# Patient Record
Sex: Male | Born: 1950 | Race: White | Hispanic: No | Marital: Married | Smoking: Never smoker
Health system: Southern US, Community
[De-identification: ages and names within clinical notes are randomized; demographics above are authoritative.]

## PROBLEM LIST (undated history)

## (undated) DIAGNOSIS — I499 Cardiac arrhythmia, unspecified: Secondary | ICD-10-CM

## (undated) DIAGNOSIS — M1990 Unspecified osteoarthritis, unspecified site: Secondary | ICD-10-CM

## (undated) DIAGNOSIS — R7303 Prediabetes: Secondary | ICD-10-CM

## (undated) DIAGNOSIS — I10 Essential (primary) hypertension: Secondary | ICD-10-CM

## (undated) HISTORY — PX: KNEE ARTHROSCOPY: SUR90

## (undated) HISTORY — PX: SHOULDER ARTHROSCOPY: SHX128

## (undated) HISTORY — PX: MULTIPLE TOOTH EXTRACTIONS: SHX2053

## (undated) HISTORY — PX: HERNIA REPAIR: SHX51

## (undated) HISTORY — PX: CARDIAC CATHETERIZATION: SHX172

---

## 2018-06-19 ENCOUNTER — Other Ambulatory Visit: Payer: Self-pay | Admitting: Orthopedic Surgery

## 2018-06-26 NOTE — Pre-Procedure Instructions (Signed)
Gordon CambridgeRandy Lee Little  06/26/2018      [id number] - [place], [place] - [id number] [place] [id number] Phone: [phone] Fax: [phone]    Your procedure is scheduled on July 09, 2018.  [facility] to [facility] Admitting at [phone] AM.  Call this number if you have problems the morning of surgery:  [phone]   Remember:  Do not eat or drink after midnight.    Take these medicines the morning of surgery with A SIP OF WATER  Amlodipine (norvasc) Omeprazole (prilosec) Afrin Nasal spray   Follow your surgeon's instructions on when to hold/resume aspirin.  If no instructions were given call the office to determine how they would like to you take aspirin  7 days prior to surgery STOP taking any diclofenac (cataflam), Aleve, Naproxen, Ibuprofen, Motrin, Advil, Goody's, BC's, all herbal medications, fish oil, and all vitamins   WHAT DO I DO ABOUT MY DIABETES MEDICATION?  Marland Kitchen. Do not take oral diabetes medicines (pills) the morning of surgery-metformin (glucophage).  Reviewed and Endorsed by [facility] Patient Education Committee, August 2015  How to Manage Your Diabetes Before and After Surgery  Why is it important to control my blood sugar before and after surgery? . Improving blood sugar levels before and after surgery helps healing and can limit problems. . A way of improving blood sugar control is eating a healthy diet by: o  Eating less sugar and carbohydrates o  Increasing activity/exercise o  Talking with your doctor about reaching your blood sugar goals . High blood sugars (greater than 180 mg/dL) can raise your risk of infections and slow your recovery, so you will need to focus on controlling your diabetes during the weeks before surgery. . Make sure that the doctor who takes care of your diabetes knows about your planned surgery including the date and location.  How do I manage my blood sugar before surgery? . Check your  blood sugar at least 4 times a day, starting 2 days before surgery, to make sure that the level is not too high or low. o Check your blood sugar the morning of your surgery when you wake up and every 2 hours until you get to the Short Stay unit. . If your blood sugar is less than 70 mg/dL, you will need to treat for low blood sugar: o Do not take insulin. o Treat a low blood sugar (less than 70 mg/dL) with  cup of clear juice (cranberry or apple), 4 glucose tablets, OR glucose gel. Recheck blood sugar in 15 minutes after treatment (to make sure it is greater than 70 mg/dL). If your blood sugar is not greater than 70 mg/dL on recheck, call [phone] o  for further instructions. . Report your blood sugar to the short stay nurse when you get to Short Stay.  . If you are admitted to the hospital after surgery: o Your blood sugar will be checked by the staff and you will probably be given insulin after surgery (instead of oral diabetes medicines) to make sure you have good blood sugar levels. o The goal for blood sugar control after surgery is 80-180 mg/dL.   [facility]- Preparing For Surgery  Before surgery, you can play an important role. Because skin is not sterile, your skin needs to be as free of germs as possible. You can reduce the number of germs on your skin by washing with CHG (chlorahexidine gluconate) Soap before surgery.  CHG is an antiseptic cleaner which kills germs and bonds with the skin to continue killing germs even after washing.    Oral Hygiene is also important to reduce your risk of infection.  Remember - BRUSH YOUR TEETH THE MORNING OF SURGERY WITH YOUR REGULAR TOOTHPASTE  Please do not use if you have an allergy to CHG or antibacterial soaps. If your skin becomes reddened/irritated stop using the CHG.  Do not shave (including legs and underarms) for at least 48 hours prior to first CHG shower. It is OK to shave your face.  Please follow these instructions  carefully.   1. Shower the NIGHT BEFORE SURGERY and the MORNING OF SURGERY with CHG.   2. If you chose to wash your hair, wash your hair first as usual with your normal shampoo.  3. After you shampoo, rinse your hair and body thoroughly to remove the shampoo.  4. Use CHG as you would any other liquid soap. You can apply CHG directly to the skin and wash gently with a scrungie or a clean washcloth.   5. Apply the CHG Soap to your body ONLY FROM THE NECK DOWN.  Do not use on open wounds or open sores. Avoid contact with your eyes, ears, mouth and genitals (private parts). Wash Face and genitals (private parts)  with your normal soap.  6. Wash thoroughly, paying special attention to the area where your surgery will be performed.  7. Thoroughly rinse your body with warm water from the neck down.  8. DO NOT shower/wash with your normal soap after using and rinsing off the CHG Soap.  9. Pat yourself dry with a CLEAN TOWEL.  10. Wear CLEAN PAJAMAS to bed the night before surgery, wear comfortable clothes the morning of surgery  11. Place CLEAN SHEETS on your bed the night of your first shower and DO NOT SLEEP WITH PETS.  Day of Surgery:  Do not apply any deodorants/lotions.  Please wear clean clothes to the hospital/surgery center.   Remember to brush your teeth WITH YOUR REGULAR TOOTHPASTE.   Do not wear jewelry  Do not wear lotions, powders, or colognes, or deodorant.  Men may shave face and neck.  Do not bring valuables to the hospital.  [facility] is not responsible for any belongings or valuables.  Contacts, dentures or bridgework may not be worn into surgery.  Leave your suitcase in the car.  After surgery it may be brought to your room.  For patients admitted to the hospital, discharge time will be determined by your treatment team.  Patients discharged the day of surgery will not be allowed to drive home.   Please read over the following fact sheets that you were  given. Pain Booklet, Coughing and Deep Breathing, MRSA Information and Surgical Site Infection Prevention

## 2018-06-27 ENCOUNTER — Ambulatory Visit (HOSPITAL_COMMUNITY)
Admission: RE | Admit: 2018-06-27 | Discharge: 2018-06-27 | Disposition: A | Discharge status: Home / Self Care | Payer: Medicare HMO | Source: Ambulatory Visit | Attending: Orthopedic Surgery | Admitting: Orthopedic Surgery

## 2018-06-27 ENCOUNTER — Encounter (HOSPITAL_COMMUNITY)
Admission: RE | Admit: 2018-06-27 | Discharge: 2018-06-27 | Disposition: A | Discharge status: Home / Self Care | Payer: Medicare HMO | Source: Ambulatory Visit | Attending: Orthopedic Surgery | Admitting: Orthopedic Surgery

## 2018-06-27 ENCOUNTER — Other Ambulatory Visit: Payer: Self-pay

## 2018-06-27 ENCOUNTER — Encounter (HOSPITAL_COMMUNITY): Payer: Self-pay

## 2018-06-27 DIAGNOSIS — Z01818 Encounter for other preprocedural examination: Secondary | ICD-10-CM | POA: Diagnosis present

## 2018-06-27 DIAGNOSIS — I700 Atherosclerosis of aorta: Secondary | ICD-10-CM | POA: Diagnosis not present

## 2018-06-27 HISTORY — DX: Unspecified osteoarthritis, unspecified site: M19.90

## 2018-06-27 HISTORY — DX: Prediabetes: R73.03

## 2018-06-27 HISTORY — DX: Essential (primary) hypertension: I10

## 2018-06-27 HISTORY — DX: Cardiac arrhythmia, unspecified: I49.9

## 2018-06-27 LAB — TYPE AND SCREEN
ABO/RH(D): A POS
Antibody Screen: NEGATIVE

## 2018-06-27 LAB — CBC WITH DIFFERENTIAL/PLATELET
ABS IMMATURE GRANULOCYTES: 0.02 10*3/uL (ref 0.00–0.07)
BASOS ABS: 0.1 10*3/uL (ref 0.0–0.1)
BASOS PCT: 1 %
EOS ABS: 0.3 10*3/uL (ref 0.0–0.5)
Eosinophils Relative: 3 %
HCT: 46.8 % (ref 39.0–52.0)
Hemoglobin: 14.8 g/dL (ref 13.0–17.0)
IMMATURE GRANULOCYTES: 0 %
Lymphocytes Relative: 25 %
Lymphs Abs: 1.9 10*3/uL (ref 0.7–4.0)
MCH: 29.4 pg (ref 26.0–34.0)
MCHC: 31.6 g/dL (ref 30.0–36.0)
MCV: 93 fL (ref 80.0–100.0)
Monocytes Absolute: 0.6 10*3/uL (ref 0.1–1.0)
Monocytes Relative: 9 %
NEUTROS ABS: 4.5 10*3/uL (ref 1.7–7.7)
NEUTROS PCT: 62 %
NRBC: 0 % (ref 0.0–0.2)
PLATELETS: 268 10*3/uL (ref 150–400)
RBC: 5.03 MIL/uL (ref 4.22–5.81)
RDW: 13.3 % (ref 11.5–15.5)
WBC: 7.4 10*3/uL (ref 4.0–10.5)

## 2018-06-27 LAB — COMPREHENSIVE METABOLIC PANEL
ALBUMIN: 4.2 g/dL (ref 3.5–5.0)
ALT: 54 U/L — AB (ref 0–44)
AST: 34 U/L (ref 15–41)
Alkaline Phosphatase: 63 U/L (ref 38–126)
Anion gap: 7 (ref 5–15)
BUN: 16 mg/dL (ref 8–23)
CHLORIDE: 105 mmol/L (ref 98–111)
CO2: 29 mmol/L (ref 22–32)
CREATININE: 0.95 mg/dL (ref 0.61–1.24)
Calcium: 9.5 mg/dL (ref 8.9–10.3)
GFR calc Af Amer: >60 mL/min (ref >60)
GFR calc non Af Amer: >60 mL/min (ref >60)
GLUCOSE: 103 mg/dL — AB (ref 70–99)
POTASSIUM: 3.9 mmol/L (ref 3.5–5.1)
SODIUM: 141 mmol/L (ref 135–145)
Total Bilirubin: 0.7 mg/dL (ref 0.3–1.2)
Total Protein: 7 g/dL (ref 6.5–8.1)

## 2018-06-27 LAB — PROTIME-INR
INR: 1.02
Prothrombin Time: 13.3 seconds (ref 11.4–15.2)

## 2018-06-27 LAB — URINALYSIS, ROUTINE W REFLEX MICROSCOPIC
Bilirubin Urine: NEGATIVE
GLUCOSE, UA: NEGATIVE mg/dL
HGB URINE DIPSTICK: NEGATIVE
KETONES UR: NEGATIVE mg/dL
LEUKOCYTES UA: NEGATIVE
Nitrite: NEGATIVE
PROTEIN: NEGATIVE mg/dL
Specific Gravity, Urine: 1.008 (ref 1.005–1.030)
pH: 7 (ref 5.0–8.0)

## 2018-06-27 LAB — ABO/RH: ABO/RH(D): A POS

## 2018-06-27 LAB — SURGICAL PCR SCREEN
MRSA, PCR: NEGATIVE
Staphylococcus aureus: NEGATIVE

## 2018-06-27 LAB — APTT: APTT: 27 s (ref 24–36)

## 2018-06-27 LAB — GLUCOSE, CAPILLARY: Glucose-Capillary: 93 mg/dL (ref 70–99)

## 2018-06-27 NOTE — Progress Notes (Addendum)
PCP is Dr. Arletta Balehomas Futrell (in [place])  LOV 06/2018 MD also manages his "pre diabetes medication" Last A1C 5.5  06/2018 Patient checks his bs once a month.  Ranges from 74-103 Patient states that "yrs ago - maybe about 8 yrs", he [facility] for ? SOB.  Per patients words, it was a non cardiace issue and that it was a med related. 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   EKG done 06/2018 (care anywhere) Last dose of aspirin will be 11/26. Clearance note inside chart.

## 2018-06-27 NOTE — Progress Notes (Signed)
   06/27/18 0900  OBSTRUCTIVE SLEEP APNEA  Have you ever been diagnosed with sleep apnea through a sleep study? No  Do you snore loudly (loud enough to be heard through closed doors)?  0  Do you often feel tired, fatigued, or sleepy during the daytime (such as falling asleep during driving or talking to someone)? 0  Has anyone observed you stop breathing during your sleep? 0  Do you have, or are you being treated for high blood pressure? 1  BMI more than 35 kg/m2? 1  Age > 50 (1-yes) 1  Neck circumference greater than:Male 16 inches or larger, Male 17inches or larger? 1  Male Gender (Yes=1) 1  Obstructive Sleep Apnea Score 5  Score 5 or greater  Results sent to PCP

## 2018-07-03 ENCOUNTER — Other Ambulatory Visit: Payer: Self-pay | Admitting: Orthopedic Surgery

## 2018-07-03 NOTE — Care Plan (Signed)
Spoke with patient prior to surgery. He will discharge to home with family and HHPT. He has [employer] Medicare and [facility] is in network with this provider. He is agreeable to this. Equipment to be delivered    [place]oonvilleRenee Angiulli, [facility] PulaskiRNCM  [phone]

## 2018-07-06 MED ORDER — BUPIVACAINE LIPOSOME 1.3 % IJ SUSP
20.0000 mL | INTRAMUSCULAR | Status: AC
  Administered 2018-07-09: 20 mL
  Filled 2018-07-06: qty 20

## 2018-07-06 MED ORDER — TRANEXAMIC ACID-NACL 1000-0.7 MG/100ML-% IV SOLN
1000.0000 mg | INTRAVENOUS | Status: AC
  Administered 2018-07-09: 1000 mg via INTRAVENOUS
  Filled 2018-07-06: qty 100

## 2018-07-09 ENCOUNTER — Ambulatory Visit (HOSPITAL_COMMUNITY): Payer: Medicare HMO | Admitting: Physician Assistant

## 2018-07-09 ENCOUNTER — Encounter (HOSPITAL_COMMUNITY): Payer: Self-pay

## 2018-07-09 ENCOUNTER — Ambulatory Visit (HOSPITAL_COMMUNITY)
Admission: RE | Admit: 2018-07-09 | Discharge: 2018-07-10 | Disposition: A | Discharge status: Home / Self Care | Payer: Medicare HMO | Source: Ambulatory Visit | Attending: Orthopedic Surgery | Admitting: Orthopedic Surgery

## 2018-07-09 ENCOUNTER — Other Ambulatory Visit: Payer: Self-pay

## 2018-07-09 ENCOUNTER — Ambulatory Visit (HOSPITAL_COMMUNITY): Payer: Medicare HMO | Admitting: Registered Nurse

## 2018-07-09 ENCOUNTER — Encounter (HOSPITAL_COMMUNITY)
Admission: RE | Disposition: A | Discharge status: Home / Self Care | Payer: Self-pay | Source: Ambulatory Visit | Attending: Orthopedic Surgery

## 2018-07-09 DIAGNOSIS — I10 Essential (primary) hypertension: Secondary | ICD-10-CM | POA: Diagnosis not present

## 2018-07-09 DIAGNOSIS — Z7982 Long term (current) use of aspirin: Secondary | ICD-10-CM | POA: Insufficient documentation

## 2018-07-09 DIAGNOSIS — E119 Type 2 diabetes mellitus without complications: Secondary | ICD-10-CM | POA: Insufficient documentation

## 2018-07-09 DIAGNOSIS — Z79899 Other long term (current) drug therapy: Secondary | ICD-10-CM | POA: Diagnosis not present

## 2018-07-09 DIAGNOSIS — M1712 Unilateral primary osteoarthritis, left knee: Secondary | ICD-10-CM | POA: Diagnosis present

## 2018-07-09 DIAGNOSIS — Z7984 Long term (current) use of oral hypoglycemic drugs: Secondary | ICD-10-CM | POA: Diagnosis not present

## 2018-07-09 HISTORY — PX: TOTAL KNEE ARTHROPLASTY: SHX125

## 2018-07-09 LAB — GLUCOSE, CAPILLARY
Glucose-Capillary: 94 mg/dL (ref 70–99)
Glucose-Capillary: 92 mg/dL (ref 70–99)
Glucose-Capillary: 195 mg/dL — ABNORMAL HIGH (ref 70–99)

## 2018-07-09 SURGERY — ARTHROPLASTY, KNEE, TOTAL
Anesthesia: Monitor Anesthesia Care | Site: Knee | Laterality: Left

## 2018-07-09 MED ORDER — CHLORHEXIDINE GLUCONATE 4 % EX LIQD: 60.0000 mL | Freq: Once | CUTANEOUS | Status: DC

## 2018-07-09 MED ORDER — BUPIVACAINE HCL 0.5 % IJ SOLN
INTRAMUSCULAR | Status: AC
  Filled 2018-07-09: qty 1

## 2018-07-09 MED ORDER — DIPHENHYDRAMINE HCL 12.5 MG/5ML PO ELIX: 12.5000 mg | ORAL_SOLUTION | ORAL | Status: DC | PRN

## 2018-07-09 MED ORDER — PROPOFOL 1000 MG/100ML IV EMUL
INTRAVENOUS | Status: AC
  Filled 2018-07-09: qty 100

## 2018-07-09 MED ORDER — HYDROCHLOROTHIAZIDE 25 MG PO TABS
25.0000 mg | ORAL_TABLET | Freq: Every day | ORAL | Status: DC
  Administered 2018-07-09 – 2018-07-10 (×2): 25 mg via ORAL
  Filled 2018-07-09 (×2): qty 1

## 2018-07-09 MED ORDER — CELECOXIB 200 MG PO CAPS
200.0000 mg | ORAL_CAPSULE | Freq: Two times a day (BID) | ORAL | Status: DC
  Administered 2018-07-09 – 2018-07-10 (×2): 200 mg via ORAL
  Filled 2018-07-09 (×2): qty 1

## 2018-07-09 MED ORDER — DEXTROSE 5 % IV SOLN
3.0000 g | INTRAVENOUS | Status: AC
  Administered 2018-07-09: 3 g via INTRAVENOUS
  Filled 2018-07-09: qty 3

## 2018-07-09 MED ORDER — FENTANYL CITRATE (PF) 100 MCG/2ML IJ SOLN
INTRAMUSCULAR | Status: AC
  Filled 2018-07-09: qty 2

## 2018-07-09 MED ORDER — DEXAMETHASONE SODIUM PHOSPHATE 10 MG/ML IJ SOLN
INTRAMUSCULAR | Status: AC
  Filled 2018-07-09: qty 1

## 2018-07-09 MED ORDER — MIDAZOLAM HCL 2 MG/2ML IJ SOLN
2.0000 mg | Freq: Once | INTRAMUSCULAR | Status: AC
  Administered 2018-07-09: 2 mg via INTRAVENOUS

## 2018-07-09 MED ORDER — ONDANSETRON HCL 4 MG/2ML IJ SOLN: 4.0000 mg | Freq: Four times a day (QID) | INTRAMUSCULAR | Status: DC | PRN

## 2018-07-09 MED ORDER — BUPIVACAINE-EPINEPHRINE 0.25% -1:200000 IJ SOLN
INTRAMUSCULAR | Status: AC
  Filled 2018-07-09: qty 1

## 2018-07-09 MED ORDER — SODIUM CHLORIDE 0.9 % IV SOLN
INTRAVENOUS | Status: DC | PRN
  Administered 2018-07-09: 20 ug/min via INTRAVENOUS

## 2018-07-09 MED ORDER — ASPIRIN EC 325 MG PO TBEC
325.0000 mg | DELAYED_RELEASE_TABLET | Freq: Two times a day (BID) | ORAL | Status: DC
  Administered 2018-07-10: 325 mg via ORAL
  Filled 2018-07-09: qty 1

## 2018-07-09 MED ORDER — TIZANIDINE HCL 2 MG PO TABS: 2.0000 mg | ORAL_TABLET | Freq: Three times a day (TID) | ORAL | 0 refills | Status: AC | PRN

## 2018-07-09 MED ORDER — ALUM & MAG HYDROXIDE-SIMETH 200-200-20 MG/5ML PO SUSP: 30.0000 mL | ORAL | Status: DC | PRN

## 2018-07-09 MED ORDER — LISINOPRIL 40 MG PO TABS
40.0000 mg | ORAL_TABLET | Freq: Every day | ORAL | Status: DC
  Administered 2018-07-09 – 2018-07-10 (×2): 40 mg via ORAL
  Filled 2018-07-09 (×2): qty 1

## 2018-07-09 MED ORDER — OXYCODONE HCL 5 MG PO TABS
5.0000 mg | ORAL_TABLET | ORAL | Status: DC | PRN
  Administered 2018-07-09 (×2): 10 mg via ORAL
  Filled 2018-07-09: qty 2

## 2018-07-09 MED ORDER — LACTATED RINGERS IV SOLN
INTRAVENOUS | Status: DC
  Administered 2018-07-09 (×2): via INTRAVENOUS

## 2018-07-09 MED ORDER — DOCUSATE SODIUM 100 MG PO CAPS: 100.0000 mg | ORAL_CAPSULE | Freq: Two times a day (BID) | ORAL | 0 refills | Status: AC

## 2018-07-09 MED ORDER — PROPOFOL 500 MG/50ML IV EMUL
INTRAVENOUS | Status: DC | PRN
  Administered 2018-07-09: 50 ug/kg/min via INTRAVENOUS

## 2018-07-09 MED ORDER — AMLODIPINE BESYLATE 10 MG PO TABS
10.0000 mg | ORAL_TABLET | Freq: Every day | ORAL | Status: DC
  Administered 2018-07-10: 10 mg via ORAL
  Filled 2018-07-09: qty 1

## 2018-07-09 MED ORDER — ONDANSETRON HCL 4 MG/2ML IJ SOLN
INTRAMUSCULAR | Status: AC
  Filled 2018-07-09: qty 2

## 2018-07-09 MED ORDER — GABAPENTIN 300 MG PO CAPS
300.0000 mg | ORAL_CAPSULE | Freq: Two times a day (BID) | ORAL | Status: DC
  Administered 2018-07-09 – 2018-07-10 (×2): 300 mg via ORAL
  Filled 2018-07-09 (×2): qty 1

## 2018-07-09 MED ORDER — BUPIVACAINE-EPINEPHRINE (PF) 0.5% -1:200000 IJ SOLN
INTRAMUSCULAR | Status: DC | PRN
  Administered 2018-07-09: 20 mL via PERINEURAL

## 2018-07-09 MED ORDER — SODIUM CHLORIDE 0.9 % IV SOLN
INTRAVENOUS | Status: DC
  Administered 2018-07-09: 21:00:00 via INTRAVENOUS

## 2018-07-09 MED ORDER — TRANEXAMIC ACID-NACL 1000-0.7 MG/100ML-% IV SOLN
1000.0000 mg | Freq: Once | INTRAVENOUS | Status: AC
  Administered 2018-07-09: 1000 mg via INTRAVENOUS
  Filled 2018-07-09: qty 100

## 2018-07-09 MED ORDER — FENTANYL CITRATE (PF) 100 MCG/2ML IJ SOLN
25.0000 ug | INTRAMUSCULAR | Status: DC | PRN
  Administered 2018-07-09 (×2): 50 ug via INTRAVENOUS

## 2018-07-09 MED ORDER — BISACODYL 5 MG PO TBEC: 5.0000 mg | DELAYED_RELEASE_TABLET | Freq: Every day | ORAL | Status: DC | PRN

## 2018-07-09 MED ORDER — ONDANSETRON HCL 4 MG/2ML IJ SOLN
INTRAMUSCULAR | Status: DC | PRN
  Administered 2018-07-09: 4 mg via INTRAVENOUS

## 2018-07-09 MED ORDER — DEXAMETHASONE SODIUM PHOSPHATE 10 MG/ML IJ SOLN
INTRAMUSCULAR | Status: DC | PRN
  Administered 2018-07-09: 10 mg via INTRAVENOUS

## 2018-07-09 MED ORDER — METFORMIN HCL 500 MG PO TABS
1000.0000 mg | ORAL_TABLET | Freq: Two times a day (BID) | ORAL | Status: DC
  Administered 2018-07-09 – 2018-07-10 (×2): 1000 mg via ORAL
  Filled 2018-07-09 (×2): qty 2

## 2018-07-09 MED ORDER — METHOCARBAMOL 500 MG PO TABS
ORAL_TABLET | ORAL | Status: AC
  Filled 2018-07-09: qty 1

## 2018-07-09 MED ORDER — METHOCARBAMOL 1000 MG/10ML IJ SOLN
500.0000 mg | Freq: Four times a day (QID) | INTRAVENOUS | Status: DC | PRN
  Filled 2018-07-09: qty 5

## 2018-07-09 MED ORDER — HYDROMORPHONE HCL 1 MG/ML IJ SOLN: 0.5000 mg | INTRAMUSCULAR | Status: DC | PRN

## 2018-07-09 MED ORDER — ACETAMINOPHEN 325 MG PO TABS
325.0000 mg | ORAL_TABLET | Freq: Four times a day (QID) | ORAL | Status: DC | PRN
  Administered 2018-07-10: 650 mg via ORAL
  Filled 2018-07-09: qty 2

## 2018-07-09 MED ORDER — BUPIVACAINE IN DEXTROSE 0.75-8.25 % IT SOLN
INTRATHECAL | Status: DC | PRN
  Administered 2018-07-09: 1.4 mL via INTRATHECAL

## 2018-07-09 MED ORDER — ONDANSETRON HCL 4 MG PO TABS: 4.0000 mg | ORAL_TABLET | Freq: Four times a day (QID) | ORAL | Status: DC | PRN

## 2018-07-09 MED ORDER — DEXAMETHASONE SODIUM PHOSPHATE 10 MG/ML IJ SOLN
10.0000 mg | Freq: Two times a day (BID) | INTRAMUSCULAR | Status: DC
  Administered 2018-07-09 – 2018-07-10 (×2): 10 mg via INTRAVENOUS
  Filled 2018-07-09 (×2): qty 1

## 2018-07-09 MED ORDER — OXYCODONE-ACETAMINOPHEN 5-325 MG PO TABS: 1.0000 | ORAL_TABLET | Freq: Four times a day (QID) | ORAL | 0 refills | Status: AC | PRN

## 2018-07-09 MED ORDER — MIDAZOLAM HCL 2 MG/2ML IJ SOLN
INTRAMUSCULAR | Status: AC
  Filled 2018-07-09: qty 2

## 2018-07-09 MED ORDER — BUPIVACAINE-EPINEPHRINE 0.25% -1:200000 IJ SOLN
INTRAMUSCULAR | Status: DC | PRN
  Administered 2018-07-09: 30 mL

## 2018-07-09 MED ORDER — MIDAZOLAM HCL 2 MG/2ML IJ SOLN
INTRAMUSCULAR | Status: AC
  Administered 2018-07-09: 2 mg via INTRAVENOUS
  Filled 2018-07-09: qty 2

## 2018-07-09 MED ORDER — BUPIVACAINE HCL (PF) 0.5 % IJ SOLN
INTRAMUSCULAR | Status: AC
  Filled 2018-07-09: qty 30

## 2018-07-09 MED ORDER — METHOCARBAMOL 500 MG PO TABS
500.0000 mg | ORAL_TABLET | Freq: Four times a day (QID) | ORAL | Status: DC | PRN
  Administered 2018-07-09 – 2018-07-10 (×3): 500 mg via ORAL
  Filled 2018-07-09 (×2): qty 1

## 2018-07-09 MED ORDER — INSULIN ASPART 100 UNIT/ML ~~LOC~~ SOLN
0.0000 [IU] | Freq: Three times a day (TID) | SUBCUTANEOUS | Status: DC
  Administered 2018-07-10: 3 [IU] via SUBCUTANEOUS

## 2018-07-09 MED ORDER — PANTOPRAZOLE SODIUM 40 MG PO TBEC
40.0000 mg | DELAYED_RELEASE_TABLET | Freq: Every day | ORAL | Status: DC
  Administered 2018-07-10: 40 mg via ORAL
  Filled 2018-07-09: qty 1

## 2018-07-09 MED ORDER — SODIUM CHLORIDE (PF) 0.9 % IJ SOLN
INTRAMUSCULAR | Status: DC | PRN
  Administered 2018-07-09: 50 mL

## 2018-07-09 MED ORDER — DOCUSATE SODIUM 100 MG PO CAPS
100.0000 mg | ORAL_CAPSULE | Freq: Two times a day (BID) | ORAL | Status: DC
  Administered 2018-07-09 – 2018-07-10 (×2): 100 mg via ORAL
  Filled 2018-07-09 (×2): qty 1

## 2018-07-09 MED ORDER — MIDAZOLAM HCL 5 MG/5ML IJ SOLN
INTRAMUSCULAR | Status: DC | PRN
  Administered 2018-07-09: 2 mg via INTRAVENOUS

## 2018-07-09 MED ORDER — PROPOFOL 10 MG/ML IV BOLUS
INTRAVENOUS | Status: DC | PRN
  Administered 2018-07-09: 20 mg via INTRAVENOUS

## 2018-07-09 MED ORDER — CEFAZOLIN SODIUM-DEXTROSE 2-4 GM/100ML-% IV SOLN
2.0000 g | Freq: Four times a day (QID) | INTRAVENOUS | Status: AC
  Administered 2018-07-09 – 2018-07-10 (×2): 2 g via INTRAVENOUS
  Filled 2018-07-09 (×2): qty 100

## 2018-07-09 MED ORDER — OXYCODONE HCL 5 MG PO TABS
ORAL_TABLET | ORAL | Status: AC
  Filled 2018-07-09: qty 2

## 2018-07-09 MED ORDER — ASPIRIN EC 325 MG PO TBEC: 325.0000 mg | DELAYED_RELEASE_TABLET | Freq: Two times a day (BID) | ORAL | 0 refills | Status: AC

## 2018-07-09 MED ORDER — POLYETHYLENE GLYCOL 3350 17 G PO PACK: 17.0000 g | PACK | Freq: Every day | ORAL | Status: DC | PRN

## 2018-07-09 MED ORDER — 0.9 % SODIUM CHLORIDE (POUR BTL) OPTIME
TOPICAL | Status: DC | PRN
  Administered 2018-07-09: 1000 mL

## 2018-07-09 MED ORDER — FENTANYL CITRATE (PF) 250 MCG/5ML IJ SOLN
INTRAMUSCULAR | Status: AC
  Filled 2018-07-09: qty 5

## 2018-07-09 MED ORDER — MAGNESIUM CITRATE PO SOLN: 1.0000 | Freq: Once | ORAL | Status: DC | PRN

## 2018-07-09 SURGICAL SUPPLY — 68 items
ATTUNE MED DOME PAT 41 KNEE (Knees) ×2 IMPLANT
ATTUNE MED DOME PAT 41MM KNEE (Knees) ×1 IMPLANT
ATTUNE PS FEM LT SZ 7 CEM KNEE (Femur) ×3 IMPLANT
ATTUNE PSRP INSR SZ7 6 KNEE (Insert) ×2 IMPLANT
ATTUNE PSRP INSR SZ7 6MM KNEE (Insert) ×1 IMPLANT
BANDAGE ESMARK 6X9 LF (GAUZE/BANDAGES/DRESSINGS) ×1 IMPLANT
BASE TIBIAL ROT PLAT SZ 7 KNEE (Knees) ×1 IMPLANT
BENZOIN TINCTURE PRP APPL 2/3 (GAUZE/BANDAGES/DRESSINGS) ×3 IMPLANT
BLADE SAGITTAL 25.0X1.19X90 (BLADE) ×2 IMPLANT
BLADE SAGITTAL 25.0X1.19X90MM (BLADE) ×1
BLADE SAW SAG 90X13X1.27 (BLADE) ×3 IMPLANT
BNDG ELASTIC 6X10 VLCR STRL LF (GAUZE/BANDAGES/DRESSINGS) ×3 IMPLANT
BNDG ESMARK 6X9 LF (GAUZE/BANDAGES/DRESSINGS) ×3
BOWL SMART MIX CTS (DISPOSABLE) ×3 IMPLANT
CEMENT HV SMART SET (Cement) ×6 IMPLANT
CLOSURE WOUND 1/2 X4 (GAUZE/BANDAGES/DRESSINGS) ×1
COVER SURGICAL LIGHT HANDLE (MISCELLANEOUS) ×3 IMPLANT
COVER WAND RF STERILE (DRAPES) ×3 IMPLANT
CUFF TOURNIQUET SINGLE 34IN LL (TOURNIQUET CUFF) ×3 IMPLANT
CUFF TOURNIQUET SINGLE 44IN (TOURNIQUET CUFF) IMPLANT
DRAPE EXTREMITY T 121X128X90 (DRAPE) ×3 IMPLANT
DRAPE U-SHAPE 47X51 STRL (DRAPES) ×3 IMPLANT
DRESSING AQUACEL AG SP 3.5X10 (GAUZE/BANDAGES/DRESSINGS) ×1 IMPLANT
DRSG AQUACEL AG ADV 3.5X10 (GAUZE/BANDAGES/DRESSINGS) IMPLANT
DRSG AQUACEL AG SP 3.5X10 (GAUZE/BANDAGES/DRESSINGS) ×3
DRSG PAD ABDOMINAL 8X10 ST (GAUZE/BANDAGES/DRESSINGS) ×3 IMPLANT
DURAPREP 26ML APPLICATOR (WOUND CARE) ×6 IMPLANT
ELECT REM PT RETURN 9FT ADLT (ELECTROSURGICAL) ×3
ELECTRODE REM PT RTRN 9FT ADLT (ELECTROSURGICAL) ×1 IMPLANT
EVACUATOR 1/8 PVC DRAIN (DRAIN) IMPLANT
FACESHIELD WRAPAROUND (MASK) ×3 IMPLANT
GAUZE SPONGE 4X4 12PLY STRL (GAUZE/BANDAGES/DRESSINGS) ×3 IMPLANT
GLOVE BIOGEL PI IND STRL 8 (GLOVE) ×3 IMPLANT
GLOVE BIOGEL PI INDICATOR 8 (GLOVE) ×6
GLOVE ECLIPSE 7.5 STRL STRAW (GLOVE) ×9 IMPLANT
GOWN STRL REUS W/ TWL LRG LVL3 (GOWN DISPOSABLE) ×1 IMPLANT
GOWN STRL REUS W/ TWL XL LVL3 (GOWN DISPOSABLE) ×2 IMPLANT
GOWN STRL REUS W/TWL LRG LVL3 (GOWN DISPOSABLE) ×2
GOWN STRL REUS W/TWL XL LVL3 (GOWN DISPOSABLE) ×4
HANDPIECE INTERPULSE COAX TIP (DISPOSABLE) ×2
HOOD PEEL AWAY FACE SHEILD DIS (HOOD) ×6 IMPLANT
IMMOBILIZER KNEE 22 UNIV (SOFTGOODS) ×3 IMPLANT
KIT BASIN OR (CUSTOM PROCEDURE TRAY) ×3 IMPLANT
KIT TURNOVER KIT B (KITS) ×3 IMPLANT
MANIFOLD NEPTUNE II (INSTRUMENTS) ×3 IMPLANT
NEEDLE 22X1 1/2 (OR ONLY) (NEEDLE) ×3 IMPLANT
NS IRRIG 1000ML POUR BTL (IV SOLUTION) ×3 IMPLANT
PACK TOTAL JOINT (CUSTOM PROCEDURE TRAY) ×3 IMPLANT
PAD ARMBOARD 7.5X6 YLW CONV (MISCELLANEOUS) ×6 IMPLANT
PADDING CAST SYN 6 (CAST SUPPLIES) ×2
PADDING CAST SYNTHETIC 6X4 NS (CAST SUPPLIES) ×1 IMPLANT
SET HNDPC FAN SPRY TIP SCT (DISPOSABLE) ×1 IMPLANT
STAPLER VISISTAT 35W (STAPLE) ×3 IMPLANT
STRIP CLOSURE SKIN 1/2X4 (GAUZE/BANDAGES/DRESSINGS) ×2 IMPLANT
SUCTION FRAZIER HANDLE 10FR (MISCELLANEOUS) ×2
SUCTION TUBE FRAZIER 10FR DISP (MISCELLANEOUS) ×1 IMPLANT
SUT MNCRL AB 3-0 PS2 18 (SUTURE) IMPLANT
SUT VIC AB 0 CTB1 27 (SUTURE) ×6 IMPLANT
SUT VIC AB 1 CT1 27 (SUTURE) ×4
SUT VIC AB 1 CT1 27XBRD ANBCTR (SUTURE) ×2 IMPLANT
SUT VIC AB 2-0 CTB1 (SUTURE) ×6 IMPLANT
SYR CONTROL 10ML LL (SYRINGE) ×6 IMPLANT
TIBIAL BASE ROT PLAT SZ 7 KNEE (Knees) ×3 IMPLANT
TOWEL OR 17X24 6PK STRL BLUE (TOWEL DISPOSABLE) ×3 IMPLANT
TOWEL OR 17X26 10 PK STRL BLUE (TOWEL DISPOSABLE) ×3 IMPLANT
TRAY CATH 16FR W/PLASTIC CATH (SET/KITS/TRAYS/PACK) IMPLANT
TRAY FOLEY MTR SLVR 16FR STAT (SET/KITS/TRAYS/PACK) IMPLANT
WRAP KNEE MAXI GEL POST OP (GAUZE/BANDAGES/DRESSINGS) ×3 IMPLANT

## 2018-07-09 NOTE — Transfer of Care (Signed)
Immediate Anesthesia Transfer of Care Note  Patient: Gordon Little  Procedure(s) Performed: TOTAL KNEE ARTHROPLASTY (Left Knee)  Patient Location: PACU  Anesthesia Type:Spinal and MAC combined with regional for post-op pain  Level of Consciousness: awake, alert  and oriented  Airway & Oxygen Therapy: Patient Spontanous Breathing and Patient connected to face mask oxygen  Post-op Assessment: Report given to RN and Post -op Vital signs reviewed and stable  Post vital signs: Reviewed and stable  Last Vitals:  Vitals Value Taken Time  BP 140/73 07/09/2018  2:23 PM  Temp    Pulse 74 07/09/2018  2:25 PM  Resp 21 07/09/2018  2:25 PM  SpO2 100 % 07/09/2018  2:25 PM  Vitals shown include unvalidated device data.  Last Pain:  Vitals:   07/09/18 1030  TempSrc:   PainSc: 1       Patients Stated Pain Goal: 5 (94/85/46 2703)  Complications: No apparent anesthesia complications

## 2018-07-09 NOTE — Discharge Instructions (Signed)

## 2018-07-09 NOTE — Brief Op Note (Signed)
07/09/2018  1:45 PM  PATIENT:  Gordon Little  67 y.o. male  PRE-OPERATIVE DIAGNOSIS:  OSTEOARTHRITIS LEFT KNEE  POST-OPERATIVE DIAGNOSIS:  OSTEOARTHRITIS LEFT KNEE  PROCEDURE:  Procedure(s): TOTAL KNEE ARTHROPLASTY (Left)  SURGEON:  Surgeon(s) and Role:    Jodi Geralds* Adham Johnson, MD - Primary  PHYSICIAN ASSISTANT:   ASSISTANTS: bethune   ANESTHESIA:   spinal  EBL:  minimal   BLOOD ADMINISTERED:none  DRAINS: none   LOCAL MEDICATIONS USED:  MARCAINE    and OTHER experel  SPECIMEN:  No Specimen  DISPOSITION OF SPECIMEN:  N/A  COUNTS:  YES  TOURNIQUET:   Total Tourniquet Time Documented: Thigh (Left) - 54 minutes Total: Thigh (Left) - 54 minutes   DICTATION: .Other Dictation: Dictation Number [id number]  PLAN OF CARE: Admit for overnight observation  PATIENT DISPOSITION:  PACU - hemodynamically stable.   Delay start of Pharmacological VTE agent (>24hrs) due to surgical blood loss or risk of bleeding: no

## 2018-07-09 NOTE — Op Note (Signed)
NAME: Gordon Little, Gordon Little MEDICAL RECORD [id number] ACCOUNT [id number] DATE OF BIRTH:Aug 14, 1950 FACILITY: [facility] LOCATION: [facility] PHYSICIAN:Maygan Koeller L. 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  PROCEDURE:  Left total knee replacement with an Attune system, size 7 femur, size 7 tibia, 6 mm bridging bearing, and a 41 mm all polyethylene patella.  SURGEON:  Jodi GeraldsJohn Mariamawit Depaoli, MD  ASSISTANT:  Gus PumaJim Bethune, PA-C, was present for the entire case and assisted with retraction, bone cuts, and closing to minimize OR time.  SURGEON:  Jodi GeraldsJohn Daymeon Fischman, MD  ANESTHESIA:  Spinal.  BRIEF HISTORY:  The patient is a 67 year old male with a long history with significant complaints of left knee pain.  He had been treated conservatively for a prolonged period of time.  After failure of all conservative care, he was taken to the  operating room for left total knee replacement.  The patient was having night pain and light activity pain prior to his surgery.  DESCRIPTION OF PROCEDURE:  The patient was taken to the operating room after adequate anesthesia was obtained with a spinal anesthetic.  The patient was placed on the operating table.  Left leg was prepped and draped in usual sterile fashion.  Following  this, the leg was exsanguinated and blood pressure tourniquet inflated to 300 mmHg.  Following this, a midline incision was made and subcutaneous tissue dissected down along the extensor mechanism.  A medial parapatellar arthrotomy was undertaken.   Following this, medial and lateral meniscus was removed, retropatellar fat pad, synovium on the anterior aspect of the femur, and the anterior and posterior cruciates.  Following this, attention was turned to the femur.  Intramedullary pilot hole was   drilled with a 5-degree valgus inclination cut, and 9 mm of distal bone was resected from the distal femur.  Attention was then turned towards sizing the femur.  It sized to a 7.  Anterior and posterior cuts were made, chamfers and box.  Attention was  then turned towards the tibia.  It was cut perpendicular to its long axis.  It sized to a 7.  It was drilled and keeled.  Following this, a 6 mm bridging bearing was put in place.  Excellent range of motion and stability were achieved.  Attention was  turned to the patella.  It was cut, and 9.5 mm of bone was resected.  A 41 paddle was chosen.  Lugs were drilled for a 41, and the trials were put in place.  The knee then went through a range of motion.  Excellent stability and range of motion were  achieved.  Following this, attention was turned towards removal of all trial components.  The knee was copiously and thoroughly lavaged with pulsatile lavage irrigation and suctioned dry.  The final components were then cemented into place, size 7 tibia,  size 7 femur, 6 mm bridging bearing trial were placed and 41 all poly patella was placed and held with a clamp.  All excess bone cement was removed.  Cement was allowed to completely harden.  When hardened, the tourniquet was let down.  The trial  components were removed.  The knee was irrigated thoroughly.  Attention was then turned towards placement of the final poly while 80 mL of Exparel, 0.25% Marcaine with epinephrine and 50 mL of saline were instilled throughout the knee and the synovial  reflection for postoperative anesthesia.  At this point, once the final poly was placed, the knee was put through a range of motion.  Excellent stability and range of motion were achieved.  A medial parapatellar arthrotomy was closed with 1 Vicryl  running.  The skin was closed with 0 and 2-0 Vicryl and 3-0 Monocryl subcuticular.  Benzoin and Steri-Strips were applied.  A sterile compressive dressing was applied.  The  patient was taken to recovery and was noted to be in satisfactory condition.   Estimated blood loss for the procedure was minimal.  LN/NUANCE  D:07/09/2018 T:07/09/2018 JOB:[id number]

## 2018-07-09 NOTE — Anesthesia Procedure Notes (Signed)
Anesthesia Regional Block: Adductor canal block   Pre-Anesthetic Checklist: ,, timeout performed, Correct Patient, Correct Site, Correct Laterality, Correct Procedure, Correct Position, site marked, Risks and benefits discussed,  Surgical consent,  Pre-op evaluation,  At surgeon's request and post-op pain management  Laterality: Left  Prep: Maximum Sterile Barrier Precautions used, chloraprep       Needles:  Injection technique: Single-shot  Needle Type: Echogenic Stimulator Needle     Needle Length: 9cm  Needle Gauge: 22     Additional Needles:   Procedures:,,,, ultrasound used (permanent image in chart),,,,  Narrative:  Start time: 07/09/2018 11:13 AM End time: 07/09/2018 11:23 AM Injection made incrementally with aspirations every 5 mL.  Performed by: Personally  Anesthesiologist: Elmer PickerWoodrum, Dahlia Nifong L, MD  Additional Notes: Monitors applied. No increased pain on injection. No increased resistance to injection. Injection made in 5cc increments. Good needle visualization. Patient tolerated procedure well.

## 2018-07-09 NOTE — Progress Notes (Signed)
Orthopedic Tech Progress Note Patient Details:  Gordon [facility]Randy [facility] [birth year] [id number]      Little Interventions Patient Tolerated: Well Instructions Provided: Care of device, Adjustment of device   Gordon PostMartinez, Gordon Little 07/09/2018, 5:12 PM

## 2018-07-09 NOTE — Progress Notes (Signed)
Awaiting transport help from PACU to 5n

## 2018-07-09 NOTE — Plan of Care (Signed)

## 2018-07-09 NOTE — Anesthesia Procedure Notes (Signed)
Spinal  Patient location during procedure: OR Start time: 07/09/2018 12:20 PM End time: 07/09/2018 12:30 PM Staffing Anesthesiologist: Elmer PickerWoodrum, Kellsey Sansone L, MD Performed: anesthesiologist  Preanesthetic Checklist Completed: patient identified, surgical consent, pre-op evaluation, timeout performed, IV checked, risks and benefits discussed and monitors and equipment checked Spinal Block Patient position: sitting Prep: site prepped and draped and DuraPrep Patient monitoring: cardiac monitor, continuous pulse ox and blood pressure Approach: midline Location: L3-4 Injection technique: single-shot Needle Needle type: Pencan  Needle gauge: 24 G Needle length: 9 cm Assessment Sensory level: T6 Additional Notes Functioning IV was confirmed and monitors were applied. Sterile prep and drape, including hand hygiene and sterile gloves were used. The patient was positioned and the spine was prepped. The skin was anesthetized with lidocaine.  Free flow of clear CSF was obtained prior to injecting local anesthetic into the CSF.  The spinal needle aspirated freely following injection.  The needle was carefully withdrawn.  The patient tolerated the procedure well.

## 2018-07-09 NOTE — Care Plan (Signed)
Ortho Bundle Case Management Note  Patient Details  Name: Gordon Little MRN: [id number] Date of Birth: [birth year]   Spoke with patient prior to surgery. He will discharge to home with family and HHPT. He has [employer] Medicare and [facility] is in network with this provider. He is agreeable to this. Equipment to be delivered                   DME Arranged:  CPM, Walker rolling DME Agency:  [employer]  HH Arranged:  PT HH [facility]  Additional Comments: Please contact me with any questions of if this plan should need to change.  Shauna Hughenee Angiulli,  RN,BSN,MHA,CCM  [facility]  [phone] 07/09/2018, 4:06 PM

## 2018-07-09 NOTE — H&P (Signed)
TOTAL KNEE ADMISSION H&P  Patient is being admitted for left total knee arthroplasty.  Subjective:  Chief Complaint:left knee pain.  HPI: Gordon Little, 67 y.o. male, has a history of pain and functional disability in the left knee due to arthritis and has failed non-surgical conservative treatments for greater than 12 weeks to includeNSAID's and/or analgesics, corticosteriod injections, viscosupplementation injections and activity modification.  Onset of symptoms was gradual, starting 5 years ago with gradually worsening course since that time. The patient noted no past surgery on the left knee(s).  Patient currently rates pain in the left knee(s) at 9 out of 10 with activity. Patient has night pain, worsening of pain with activity and weight bearing, pain that interferes with activities of daily living, pain with passive range of motion and joint swelling.  Patient has evidence of subchondral sclerosis, periarticular osteophytes, joint subluxation and joint space narrowing by imaging studies. This patient has had failure of all reasonable conservative care. There is no active infection.  There are no active problems to display for this patient.  Past Medical History:  Diagnosis Date  . Arthritis   . Dysrhythmia    PVC'S  . Hypertension   . Pre-diabetes    controlled with metformin    Past Surgical History:  Procedure Laterality Date  . CARDIAC CATHETERIZATION     [facility]  . HERNIA REPAIR    . KNEE ARTHROSCOPY Left   . MULTIPLE TOOTH EXTRACTIONS     currently has full set of dentures   . SHOULDER ARTHROSCOPY     right -- 'stitched up some muscles that were torn'    Current Facility-Administered Medications  Medication Dose Route Frequency Provider Last Rate Last Dose  . bupivacaine liposome (EXPAREL) 1.3 % injection 266 mg  20 mL Infiltration To OR [name], MD      . ceFAZolin (ANCEF) 3 g in dextrose 5 % 50 mL IVPB  3 g Intravenous To [name], [name], MD      . chlorhexidine (HIBICLENS) 4 % liquid 4 application  60 mL Topical Once Dorna Leitz, MD      . fentaNYL (SUBLIMAZE) 100 MCG/2ML injection           . lactated ringers infusion   Intravenous Continuous Woodrum, Chelsey L, MD      . tranexamic acid (CYKLOKAPRON) IVPB 1,000 mg  1,000 mg Intravenous To OR Dorna Leitz, MD       Facility-Administered Medications Ordered in Other Encounters  Medication Dose Route Frequency Provider Last Rate Last Dose  . bupivacaine-epinephrine (MARCAINE W/ EPI) 0.5% -1:200000 injection    Anesthesia Intra-op Woodrum, Chelsey L, MD   20 mL at 07/09/18 1123   No Known Allergies  Social History   Tobacco Use  . Smoking status: Never Smoker  . Smokeless tobacco: Never Used  Substance Use Topics  . Alcohol use: Yes    Alcohol/week: 3.0 standard drinks    Types: 2 Glasses of wine, 1 Cans of beer per week    History reviewed. No pertinent family history.   ROS ROS: I have reviewed the patient's review of systems thoroughly and there are no positive responses as relates to the HPI. Objective:  Physical Exam  Vital signs in last 24 hours: Temp:  [97.9 F (36.6 C)] 97.9 F (36.6 C) (12/02 1021) Pulse Rate:  [62-82] 62 (12/02 1130) Resp:  [10-18] 10 (12/02 1130) BP: (141-175)/(66-74) 141/66 (12/02 1130) SpO2:  [96 %-99 %]  99 % (12/02 1130) Weight:  [117.9 kg] 117.9 kg (12/02 1021) Well-developed well-nourished patient in no acute distress. Alert and oriented x3 HEENT:within normal limits Cardiac: Regular rate and rhythm Pulmonary: Lungs clear to auscultation Abdomen: Soft and nontender.  Normal active bowel sounds  Musculoskeletal: (Left knee: Painful range of motion.  Limited range of motion.  No instability.  Trace effusion. Labs: Recent Results (from the past 2160 hour(s))  Glucose, capillary     Status: None   Collection Time: 06/27/18  8:39 AM  Result Value Ref Range   Glucose-Capillary 93 70 - 99 mg/dL  Surgical pcr  screen     Status: None   Collection Time: 06/27/18  9:22 AM  Result Value Ref Range   MRSA, PCR NEGATIVE NEGATIVE   Staphylococcus aureus NEGATIVE NEGATIVE    Comment: (NOTE) The Xpert SA Assay (FDA approved for NASAL specimens in patients 1 years of age and older), is one component of a comprehensive surveillance program. It is not intended to diagnose infection nor to guide or monitor treatment. Performed at [facility], [place] [address], [place], [place] [address]   APTT     Status: None   Collection Time: 06/27/18  9:22 AM  Result Value Ref Range   aPTT 27 24 - 36 seconds    Comment: Performed at [facility] [address] [place], [place] [address]  CBC WITH DIFFERENTIAL     Status: None   Collection Time: 06/27/18  9:22 AM  Result Value Ref Range   WBC 7.4 4.0 - 10.5 K/uL   RBC 5.03 4.22 - 5.81 MIL/uL   Hemoglobin 14.8 13.0 - 17.0 g/dL   HCT 46.8 39.0 - 52.0 %   MCV 93.0 80.0 - 100.0 fL   MCH 29.4 26.0 - 34.0 pg   MCHC 31.6 30.0 - 36.0 g/dL   RDW 13.3 11.5 - 15.5 %   Platelets 268 150 - 400 K/uL   nRBC 0.0 0.0 - 0.2 %   Neutrophils Relative % 62 %   Neutro Abs 4.5 1.7 - 7.7 K/uL   Lymphocytes Relative 25 %   Lymphs Abs 1.9 0.7 - 4.0 K/uL   Monocytes Relative 9 %   Monocytes Absolute 0.6 0.1 - 1.0 K/uL   Eosinophils Relative 3 %   Eosinophils Absolute 0.3 0.0 - 0.5 K/uL   Basophils Relative 1 %   Basophils Absolute 0.1 0.0 - 0.1 K/uL   Immature Granulocytes 0 %   Abs Immature Granulocytes 0.02 0.00 - 0.07 K/uL    Comment: Performed at [facility], [address] [place], [place] [address]  Comprehensive metabolic panel     Status: Abnormal   Collection Time: 06/27/18  9:22 AM  Result Value Ref Range   Sodium 141 135 - 145 mmol/L   Potassium 3.9 3.5 - 5.1 mmol/L   Chloride 105 98 - 111 mmol/L   CO2 29 22 - 32 mmol/L   Glucose, Bld 103 (H) 70 - 99 mg/dL   BUN 16 8 - 23 mg/dL   Creatinine, Ser 0.95 0.61 - 1.24 mg/dL   Calcium 9.5 8.9 -  10.3 mg/dL   Total Protein 7.0 6.5 - 8.1 g/dL   Albumin 4.2 3.5 - 5.0 g/dL   AST 34 15 - 41 U/L   ALT 54 (H) 0 - 44 U/L   Alkaline Phosphatase 63 38 - 126 U/L   Total Bilirubin 0.7 0.3 - 1.2 mg/dL   GFR calc  non Af Amer >60 >60 mL/min   GFR calc Af Amer >60 >60 mL/min    Comment: (NOTE) The eGFR has been calculated using the CKD EPI equation. This calculation has not been validated in all clinical situations. eGFR's persistently <60 mL/min signify possible Chronic Kidney Disease.    Anion gap 7 5 - 15    Comment: Performed at [facility] [address], [place], [place] [address]  Protime-INR     Status: None   Collection Time: 06/27/18  9:22 AM  Result Value Ref Range   Prothrombin Time 13.3 11.4 - 15.2 seconds   INR 1.02     Comment: Performed at [facility] [address], [place], [place] [address]  Urinalysis, Routine w reflex microscopic     Status: Abnormal   Collection Time: 06/27/18  9:23 AM  Result Value Ref Range   Color, Urine STRAW (A) YELLOW   APPearance CLEAR CLEAR   Specific Gravity, Urine 1.008 1.005 - 1.030   pH 7.0 5.0 - 8.0   Glucose, UA NEGATIVE NEGATIVE mg/dL   Hgb urine dipstick NEGATIVE NEGATIVE   Bilirubin Urine NEGATIVE NEGATIVE   Ketones, ur NEGATIVE NEGATIVE mg/dL   Protein, ur NEGATIVE NEGATIVE mg/dL   Nitrite NEGATIVE NEGATIVE   Leukocytes, UA NEGATIVE NEGATIVE    Comment: Performed at [facility] [address], [place], [place] [address]  Type and screen Order type and screen if day of surgery is less than 15 days from draw of preadmission visit or order morning of surgery if day of surgery is greater than 6 days from preadmission visit.     Status: None   Collection Time: 06/27/18  9:37 AM  Result Value Ref Range   ABO/RH(D) A POS    Antibody Screen NEG    Sample Expiration 07/11/2018    Extend sample reason      NO TRANSFUSIONS OR PREGNANCY IN THE PAST 3 MONTHS Performed at [facility], [place] [address] [place], [place] [address]   ABO/Rh     Status: None   Collection Time: 06/27/18  9:37 AM  Result Value Ref Range   ABO/RH(D)      A POS Performed at [facility] [address], [place], [place] [address]   Glucose, capillary     Status: None   Collection Time: 07/09/18 10:22 AM  Result Value Ref Range   Glucose-Capillary 94 70 - 99 mg/dL   Comment 1 Notify RN    Comment 2 Document in Chart     Estimated body mass index is 37.31 kg/m as calculated from the following:   Height as of this encounter: '5\' 10"'$  (1.778 m).   Weight as of this encounter: 117.9 kg.   Imaging Review Plain radiographs demonstrate severe degenerative joint disease of the left knee(s). The overall alignment ismild varus. The bone quality appears to be fair for age and reported activity level.   Preoperative templating of the joint replacement has been completed, documented, and submitted to the Operating Room personnel in order to optimize intra-operative equipment management.    Patient's anticipated LOS is less than 2 midnights, meeting these requirements: - Younger than 38 - Lives within 1 hour of care - Has a competent adult at home to recover with post-op recover - NO history of  - Chronic pain requiring opiods  - Diabetes  - Coronary Artery Disease  - Heart failure  - Heart attack  -  Stroke  - DVT/VTE  - Cardiac arrhythmia  - Respiratory Failure/COPD  - Renal failure  - Anemia  - Advanced Liver disease        Assessment/Plan:  End stage arthritis, left knee   The patient history, physical examination, clinical judgment of the provider and imaging studies are consistent with end stage degenerative joint disease of the left knee(s) and total knee arthroplasty is deemed medically necessary. The treatment options including medical management, injection therapy arthroscopy and arthroplasty were discussed at length. The risks and benefits of total knee arthroplasty were presented  and reviewed. The risks due to aseptic loosening, infection, stiffness, patella tracking problems, thromboembolic complications and other imponderables were discussed. The patient acknowledged the explanation, agreed to proceed with the plan and consent was signed. Patient is being admitted for inpatient treatment for surgery, pain control, PT, OT, prophylactic antibiotics, VTE prophylaxis, progressive ambulation and ADL's and discharge planning. The patient is planning to be discharged home with home health services

## 2018-07-09 NOTE — Anesthesia Postprocedure Evaluation (Signed)
Anesthesia Post Note  Patient: Lorenza CambridgeRandy Lee Parlow  Procedure(s) Performed: TOTAL KNEE ARTHROPLASTY (Left Knee)     Patient location during evaluation: PACU Anesthesia Type: Regional and Spinal Level of consciousness: oriented and awake and alert Pain management: pain level controlled Vital Signs Assessment: post-procedure vital signs reviewed and stable Respiratory status: spontaneous breathing, respiratory function stable and patient connected to nasal cannula oxygen Cardiovascular status: blood pressure returned to baseline and stable Postop Assessment: no headache, no backache and no apparent nausea or vomiting Anesthetic complications: no    Last Vitals:  Vitals:   07/09/18 1755 07/09/18 1800  BP:    Pulse: 87 92  Resp: 17 15  Temp:    SpO2: 96% 96%    Last Pain:  Vitals:   07/09/18 1730  TempSrc:   PainSc: 0-No pain                 Navneet Schmuck L Prynce Jacober

## 2018-07-09 NOTE — Anesthesia Procedure Notes (Signed)
Procedure Name: General with mask airway Date/Time: 07/09/2018 12:20 PM Performed by: Laruth BouchardGolob, Jaydian Santana C., CRNA Pre-anesthesia Checklist: Patient identified, Emergency Drugs available, Suction available, Patient being monitored and Timeout performed Patient Re-evaluated:Patient Re-evaluated prior to induction Oxygen Delivery Method: Simple face mask Induction Type: IV induction

## 2018-07-09 NOTE — Anesthesia Preprocedure Evaluation (Addendum)
Anesthesia Evaluation  Patient identified by MRN, date of birth, ID band Patient awake    Reviewed: Allergy & Precautions, NPO status , Patient's Chart, lab work & pertinent test results  Airway Mallampati: II  TM Distance: >3 FB Neck ROM: Full    Dental no notable dental hx. (+) Edentulous Upper, Edentulous Lower   Pulmonary neg pulmonary ROS,    Pulmonary exam normal breath sounds clear to auscultation       Cardiovascular hypertension, Pt. on medications negative cardio ROS Normal cardiovascular exam Rhythm:Regular Rate:Normal     Neuro/Psych negative neurological ROS  negative psych ROS   GI/Hepatic Neg liver ROS, GERD  ,  Endo/Other  diabetes, Type 2, Oral Hypoglycemic Agents  Renal/GU negative Renal ROS  negative genitourinary   Musculoskeletal  (+) Arthritis , Osteoarthritis,    Abdominal   Peds  Hematology negative hematology ROS (+)   Anesthesia Other Findings   Reproductive/Obstetrics                            Anesthesia Physical Anesthesia Plan  ASA: II  Anesthesia Plan: Spinal and Regional   Post-op Pain Management:  Regional for Post-op pain   Induction:   PONV Risk Score and Plan: 1 and Treatment may vary due to age or medical condition and Propofol infusion  Airway Management Planned: Natural Airway and Simple Face Mask  Additional Equipment:   Intra-op Plan:   Post-operative Plan:   Informed Consent: I have reviewed the patients History and Physical, chart, labs and discussed the procedure including the risks, benefits and alternatives for the proposed anesthesia with the patient or authorized representative who has indicated his/her understanding and acceptance.   Dental advisory given  Plan Discussed with: CRNA  Anesthesia Plan Comments:         Anesthesia Quick Evaluation

## 2018-07-10 ENCOUNTER — Encounter (HOSPITAL_COMMUNITY): Payer: Self-pay | Admitting: Orthopedic Surgery

## 2018-07-10 DIAGNOSIS — M1712 Unilateral primary osteoarthritis, left knee: Secondary | ICD-10-CM | POA: Diagnosis not present

## 2018-07-10 LAB — CBC
HCT: 39.3 % (ref 39.0–52.0)
Hemoglobin: 12.7 g/dL — ABNORMAL LOW (ref 13.0–17.0)
MCH: 29.5 pg (ref 26.0–34.0)
MCHC: 32.3 g/dL (ref 30.0–36.0)
MCV: 91.2 fL (ref 80.0–100.0)
Platelets: 241 10*3/uL (ref 150–400)
RBC: 4.31 MIL/uL (ref 4.22–5.81)
RDW: 13 % (ref 11.5–15.5)
WBC: 14.7 10*3/uL — ABNORMAL HIGH (ref 4.0–10.5)
nRBC: 0 % (ref 0.0–0.2)

## 2018-07-10 LAB — GLUCOSE, CAPILLARY
Glucose-Capillary: 142 mg/dL — ABNORMAL HIGH (ref 70–99)
Glucose-Capillary: 156 mg/dL — ABNORMAL HIGH (ref 70–99)
Glucose-Capillary: 119 mg/dL — ABNORMAL HIGH (ref 70–99)

## 2018-07-10 NOTE — Progress Notes (Signed)
Physical Therapy Treatment Patient Details Name: Gordon Little MRN: [id number] DOB: 1950-10-05 Today's Date: 07/10/2018    History of Present Illness 67yo male with received L TKA on 07/09/18. PMH HTN, pre-DM, cardiac cath, knee scope, shoulder scope     PT Comments    Patient received up in chair, pleasant and willing to participate in afternoon session; wife present and observed session today. BP sitting 166/73, standing 177/88, RN aware. Verbally reviewed and practiced total knee HEP, then able to perform functional transfers with S and RW. Able to gait train approximately 157fx2 with S, RW, and ongoing cues for gait mechanics including upright posture and heel-toe pattern. Introduced sIT trainerwith min guard and Min-Mod cues for sequencing today, patient safe and steady but does require intermittent cues for proper sequence, spouse able to assist and provide teach back of correct sequence. He was left up in recliner with family present and all needs met, all questions/concerns addressed, and RN aware of patient status.     Follow Up Recommendations  Follow surgeon's recommendation for DC plan and follow-up therapies     Equipment Recommendations  Rolling walker with 5" wheels;3in1 (PT)    Recommendations for Other Services       Precautions / Restrictions Precautions Precautions: Fall Required Braces or Orthoses: Knee Immobilizer - Left Knee Immobilizer - Left: On when out of bed or walking Restrictions Weight Bearing Restrictions: Yes LLE Weight Bearing: Weight bearing as tolerated    Mobility  Bed Mobility               General bed mobility comments: OOB in chair   Transfers Overall transfer level: Needs assistance Equipment used: Rolling walker (2 wheeled) Transfers: Sit to/from Stand Sit to Stand: Supervision         General transfer comment: min guard, cues for safety and sequencing with RW   Ambulation/Gait Ambulation/Gait assistance:  Supervision Gait Distance (Feet): 160 Feet(x2) Assistive device: Rolling walker (2 wheeled) Gait Pattern/deviations: Step-through pattern;Decreased step length - right;Decreased stance time - left;Decreased dorsiflexion - left;Decreased dorsiflexion - right;Decreased weight shift to left;Trunk flexed;Narrow base of support Gait velocity: decreased    General Gait Details: ongoing cues for upright posture (patient reports this is difficulty due to progressive lenses/being able to see in front of him well), heel-toe pattern    Stairs Stairs: Yes Stairs assistance: Min guard Stair Management: One rail Right;Step to pattern;Forwards(R rail ascent/L rail descent ) Number of Stairs: 5(2 large steps/3 small steps ) General stair comments: min guard for safety, Min-Mod cues for safety and sequencing   Wheelchair Mobility    Modified Rankin (Stroke Patients Only)       Balance Overall balance assessment: Mild deficits observed, not formally tested                                          Cognition Arousal/Alertness: Awake/alert Behavior During Therapy: WFL for tasks assessed/performed Overall Cognitive Status: Within Functional Limits for tasks assessed                                        Exercises      General Comments        Pertinent Vitals/Pain Pain Assessment: 0-10 Pain Score: 3  Pain Location: L knee with gait  Pain  Descriptors / Indicators: Aching;Sore Pain Intervention(s): Limited activity within patient's tolerance;Monitored during session;Repositioned;Relaxation    Home Living                      Prior Function            PT Goals (current goals can now be found in the care plan section) Acute Rehab PT Goals Patient Stated Goal: go home, start HHPT  PT Goal Formulation: With patient/family Time For Goal Achievement: 07/24/18 Potential to Achieve Goals: Good Progress towards PT goals: Progressing toward  goals    Frequency    7X/week      PT Plan Current plan remains appropriate    Co-evaluation              AM-PAC PT "6 Clicks" Mobility   Outcome Measure  Help needed turning from your back to your side while in a flat bed without using bedrails?: None Help needed moving from lying on your back to sitting on the side of a flat bed without using bedrails?: A Little Help needed moving to and from a bed to a chair (including a wheelchair)?: A Little Help needed standing up from a chair using your arms (e.g., wheelchair or bedside chair)?: A Little Help needed to walk in hospital room?: A Little Help needed climbing 3-5 steps with a railing? : A Little 6 Click Score: 19    End of Session   Activity Tolerance: Patient tolerated treatment well Patient left: in chair;with call bell/phone within reach;with family/visitor present Nurse Communication: Mobility status;Other (comment)(elevated BP ) PT Visit Diagnosis: Unsteadiness on feet (R26.81);Difficulty in walking, not elsewhere classified (R26.2);Muscle weakness (generalized) (M62.81)     Time: 7614-7092 PT Time Calculation (min) (ACUTE ONLY): 38 min  Charges:  $Gait Training: 23-37 mins $Therapeutic Exercise: 8-22 mins                     Deniece Ree PT, DPT, CBIS  Supplemental Physical Therapist [place]    Pager [phone] Acute Rehab Office [phone]

## 2018-07-10 NOTE — Progress Notes (Signed)
Subjective: 1 Day Post-Op Procedure(s) (LRB): TOTAL KNEE ARTHROPLASTY (Left) Patient reports pain as mild.  Taking by mouth and voiding okay.  Positive flatus.  Ready to go home this afternoon.  Objective: Vital signs in last 24 hours: Temp:  [97.5 F (36.4 C)-97.9 F (36.6 C)] 97.6 F (36.4 C) (12/03 0352) Pulse Rate:  [62-100] 65 (12/03 0352) Resp:  [10-23] 16 (12/03 0352) BP: (120-175)/(52-106) 137/73 (12/03 0352) SpO2:  [91 %-100 %] 95 % (12/03 0352) Weight:  [117.9 kg] 117.9 kg (12/02 1021)  Intake/Output from previous day: 12/02 0701 - 12/03 0700 In: 2067.4 [P.O.:120; I.V.:1491.4; IV Piggyback:456] Out: 1200 [Urine:1175; Blood:25] Intake/Output this shift: Total I/O In: -  Out: 325 [Urine:325]  Recent Labs    07/10/18 0338  HGB 12.7*   Recent Labs    07/10/18 0338  WBC 14.7*  RBC 4.31  HCT 39.3  PLT 241   No results for input(s): NA, K, CL, CO2, BUN, CREATININE, GLUCOSE, CALCIUM in the last 72 hours. No results for input(s): LABPT, INR in the last 72 hours. Left knee exam: Neurovascular intact Sensation intact distally Intact pulses distally Dorsiflexion/Plantar flexion intact Incision: dressing C/D/I Compartment soft   Patient's anticipated LOS is less than 2 midnights, meeting these requirements: - Lives within 1 hour of care - Has a competent adult at home to recover with post-op recover - NO history of  - Chronic pain requiring opiods   - Coronary Artery Disease  - Heart failure  - Heart attack  - Stroke  - DVT/VTE  - Cardiac arrhythmia  - Respiratory Failure/COPD  - Renal failure  - Anemia  - Advanced Liver disease      Assessment/Plan: 1 Day Post-Op Procedure(s) (LRB): TOTAL KNEE ARTHROPLASTY (Left)  Plan: Weight-bear as tolerated on left. Aspirin 325 mg enteric-coated twice daily x1 month postop for DVT prophylaxis. Up with therapy Discharge home with home health  Follow-up with Dr. Luiz BlareGraves on 07/23/2018.    Gordon FolksJames G  Alessa Little 07/10/2018, 8:23 AM

## 2018-07-10 NOTE — Evaluation (Signed)
Physical Therapy Evaluation Patient Details Name: Gordon Little MRN: [id number] DOB: 1951-07-14 Today's Date: 07/10/2018   History of Present Illness  67yo male with received L TKA on 07/09/18. PMH HTN, pre-DM, cardiac cath, knee scope, shoulder scope   Clinical Impression  Patient received in bed, pleasant and willing to work with therapy this morning. L knee AROM in supine: 17 degrees extension, 81 degrees flexion. Knee immobilizer applied with totalA for time management in supine, then able to perform bed mobility with Min guard to support L LE; became mildly dizzy at EOB which improved with static rest in sitting, then able to perform sit to stand with min guard and RW, again mildly dizzy in standing but this quickly improved. Able to tolerate gait training approximately 163f with min guard, RW, and cues for sequencing, gait mechanics, and safety, mildly dizzy again at end of gait period. BP taken sitting up in chair 147/76. He was left up in the recliner with spouse present and all needs otherwise met. Plan to practice stairs, review HEP, and further assess BP with positional changes this afternoon.     Follow Up Recommendations Follow surgeon's recommendation for DC plan and follow-up therapies    Equipment Recommendations  Rolling walker with 5" wheels;3in1 (PT)    Recommendations for Other Services       Precautions / Restrictions Precautions Precautions: Fall Required Braces or Orthoses: Knee Immobilizer - Left Knee Immobilizer - Left: On when out of bed or walking Restrictions Weight Bearing Restrictions: Yes LLE Weight Bearing: Weight bearing as tolerated      Mobility  Bed Mobility Overal bed mobility: Needs Assistance Bed Mobility: Supine to Sit     Supine to sit: Min guard     General bed mobility comments: Min guard to support L LE during transfer   Transfers Overall transfer level: Needs assistance Equipment used: Rolling walker (2 wheeled) Transfers: Sit  to/from Stand Sit to Stand: Min guard         General transfer comment: min guard, cues for safety and sequencing with RW   Ambulation/Gait Ambulation/Gait assistance: Min guard Gait Distance (Feet): 100 Feet Assistive device: Rolling walker (2 wheeled) Gait Pattern/deviations: Step-through pattern;Decreased step length - right;Decreased stance time - left;Decreased dorsiflexion - left;Decreased dorsiflexion - right;Decreased weight shift to left;Trunk flexed;Narrow base of support Gait velocity: decreased    General Gait Details: cues for upright posture, equal step/stance lengths and times, heel-toe pattern, and sequencing with RW   Stairs            Wheelchair Mobility    Modified Rankin (Stroke Patients Only)       Balance Overall balance assessment: Mild deficits observed, not formally tested                                           Pertinent Vitals/Pain Pain Assessment: 0-10 Pain Score: 3  Pain Location: L knee with gait  Pain Descriptors / Indicators: Aching;Sore Pain Intervention(s): Limited activity within patient's tolerance;Monitored during session;Repositioned    Home Living Family/patient expects to be discharged to:: Private residence Living Arrangements: Spouse/significant other Available Help at Discharge: Family;Available 24 hours/day Type of Home: House Home Access: Stairs to enter Entrance Stairs-Rails: Right Entrance Stairs-Number of Steps: 3 Home Layout: One level Home Equipment: Cane - single point;Toilet riser;Crutches;Walker - 2 wheels      Prior Function Level of Independence:  Independent               Hand Dominance        Extremity/Trunk Assessment   Upper Extremity Assessment Upper Extremity Assessment: Overall WFL for tasks assessed    Lower Extremity Assessment Lower Extremity Assessment: Generalized weakness    Cervical / Trunk Assessment Cervical / Trunk Assessment: Normal  Communication    Communication: No difficulties  Cognition Arousal/Alertness: Awake/alert Behavior During Therapy: WFL for tasks assessed/performed Overall Cognitive Status: Within Functional Limits for tasks assessed                                        General Comments      Exercises Total Joint Exercises Goniometric ROM: L knee AROM in supine: 17 degrees extension, 81 degrees flexion    Assessment/Plan    PT Assessment Patient needs continued PT services  PT Problem List Decreased strength;Decreased range of motion;Decreased knowledge of use of DME;Obesity;Decreased safety awareness;Decreased balance;Decreased mobility       PT Treatment Interventions DME instruction;Balance training;Gait training;Neuromuscular re-education;Stair training;Functional mobility training;Patient/family education;Therapeutic activities;Therapeutic exercise;Manual techniques    PT Goals (Current goals can be found in the Care Plan section)  Acute Rehab PT Goals Patient Stated Goal: go home, start HHPT  PT Goal Formulation: With patient/family Time For Goal Achievement: 07/24/18 Potential to Achieve Goals: Good    Frequency 7X/week   Barriers to discharge        Co-evaluation               AM-PAC PT "6 Clicks" Mobility  Outcome Measure Help needed turning from your back to your side while in a flat bed without using bedrails?: None Help needed moving from lying on your back to sitting on the side of a flat bed without using bedrails?: A Little Help needed moving to and from a bed to a chair (including a wheelchair)?: A Little Help needed standing up from a chair using your arms (e.g., wheelchair or bedside chair)?: A Little Help needed to walk in hospital room?: A Little Help needed climbing 3-5 steps with a railing? : A Little 6 Click Score: 19    End of Session Equipment Utilized During Treatment: Gait belt Activity Tolerance: Patient tolerated treatment well Patient left:  in chair;with call bell/phone within reach;with family/visitor present   PT Visit Diagnosis: Unsteadiness on feet (R26.81);Difficulty in walking, not elsewhere classified (R26.2);Muscle weakness (generalized) (M62.81)    Time: 9791-5041 PT Time Calculation (min) (ACUTE ONLY): 34 min   Charges:   PT Evaluation $PT Eval Low Complexity: 1 Low PT Treatments $Gait Training: 8-22 mins        Deniece Ree PT, DPT, CBIS  Supplemental Physical Therapist [place]    Pager [phone] Acute Rehab Office [phone]

## 2018-07-10 NOTE — Discharge Summary (Signed)
Patient ID: Peterson Mathey MRN: [id number] DOB/AGE: November 02, 1950 67 y.o.  Admit date: 07/09/2018 Discharge date: 07/10/2018  Admission Diagnoses:  Principal Problem:   Primary osteoarthritis of left knee Active Problems:   Diabetes [facility])   Discharge Diagnoses:  Same  Past Medical History:  Diagnosis Date  . Arthritis   . Dysrhythmia    PVC'S  . Hypertension   . Pre-diabetes    controlled with metformin    Surgeries: Procedure(s): Left TOTAL KNEE ARTHROPLASTY on 07/09/2018   Discharged Condition: Improved  Hospital Course: Zarius Furr is an 67 y.o. male who was admitted 07/09/2018 for operative treatment ofPrimary osteoarthritis of left knee. Patient has severe unremitting pain that affects sleep, daily activities, and work/hobbies. After pre-op clearance the patient was taken to the operating room on 07/09/2018 and underwent  Procedure(s): TOTAL KNEE ARTHROPLASTY.    Patient was given perioperative antibiotics:  Anti-infectives (From admission, onward)   Start     Dose/Rate Route Frequency Ordered Stop   07/09/18 2130  ceFAZolin (ANCEF) IVPB 2g/100 mL premix     2 g 200 mL/hr over 30 Minutes Intravenous Every 6 hours 07/09/18 2012 07/10/18 0243   07/09/18 1130  ceFAZolin (ANCEF) 3 g in dextrose 5 % 50 mL IVPB     3 g 100 mL/hr over 30 Minutes Intravenous To ShortStay Surgical 07/09/18 0651 07/09/18 1258       Patient was given sequential compression devices, early ambulation, and chemoprophylaxis to prevent DVT.  Patient benefited maximally from hospital stay and there were no complications.    Recent vital signs:  Patient Vitals for the past 24 hrs:  BP Temp Temp src Pulse Resp SpO2 Height Weight  07/10/18 0352 137/73 97.6 F (36.4 C) Oral 65 16 95 % - -  07/10/18 0050 (!) 124/54 (!) 97.5 F (36.4 C) Oral 67 20 94 % - -  07/09/18 2016 136/62 97.9 F (36.6 C) Oral 81 18 91 % - -  07/09/18 1938 (!) 120/57 97.9 F (36.6 C) - 73 12 91 % - -  07/09/18 1908 (!)  125/58 - - 88 15 93 % - -  07/09/18 1840 - - - 80 16 96 % - -  07/09/18 1839 (!) 130/59 - - 79 20 96 % - -  07/09/18 1830 - - - 80 16 94 % - -  07/09/18 1815 - - - 82 16 97 % - -  07/09/18 1800 - - - 92 15 96 % - -  07/09/18 1755 - - - 87 17 96 % - -  07/09/18 1750 - - - 73 16 96 % - -  07/09/18 1745 - - - 75 14 97 % - -  07/09/18 1740 (!) 143/65 - - 70 12 94 % - -  07/09/18 1735 - - - 89 14 97 % - -  07/09/18 1730 - - - 100 16 97 % - -  07/09/18 1725 - - - 75 15 96 % - -  07/09/18 1720 - - - 76 17 98 % - -  07/09/18 1715 - - - 68 12 95 % - -  07/09/18 1710 140/74 - - 66 10 96 % - -  07/09/18 1705 - - - 65 12 97 % - -  07/09/18 1700 - - - 63 12 97 % - -  07/09/18 1655 - - - 65 11 96 % - -  07/09/18 1650 - - - 67 10 97 % - -  07/09/18 1645 - - -  65 12 96 % - -  07/09/18 1640 (!) 154/68 - - 66 10 98 % - -  07/09/18 1635 - - - 70 14 95 % - -  07/09/18 1630 - - - 84 14 96 % - -  07/09/18 1625 - - - 74 13 97 % - -  07/09/18 1620 - - - 76 16 98 % - -  07/09/18 1615 - - - 75 13 98 % - -  07/09/18 1610 (!) 168/70 - - 67 12 99 % - -  07/09/18 1605 - - - 71 15 98 % - -  07/09/18 1600 - - - 72 (!) 22 99 % - -  07/09/18 1555 - - - 72 14 98 % - -  07/09/18 1550 - - - 69 12 100 % - -  07/09/18 1545 - - - 84 (!) 23 96 % - -  07/09/18 1540 (!) 165/75 - - 77 14 94 % - -  07/09/18 1535 - - - 79 (!) 21 98 % - -  07/09/18 1530 - - - 75 12 98 % - -  07/09/18 1525 (!) 150/106 - - 77 12 97 % - -  07/09/18 1515 - - - 65 12 99 % - -  07/09/18 1510 (!) 139/52 - - 65 14 97 % - -  07/09/18 1500 - - - 70 13 96 % - -  07/09/18 1455 (!) 125/55 - - 66 12 96 % - -  07/09/18 1445 - - - 62 13 97 % - -  07/09/18 1440 121/69 - - 66 13 95 % - -  07/09/18 1430 140/73 97.9 F (36.6 C) - 71 18 97 % - -  07/09/18 1425 140/73 - - 74 (!) 21 100 % - -  07/09/18 1130 (!) 141/66 - - 62 10 99 % - -  07/09/18 1125 (!) 175/72 - - 74 16 99 % - -  07/09/18 1120 - - - 72 14 96 % - -  07/09/18 1021 (!) 173/74 97.9 F  (36.6 C) Oral 82 18 97 % 5\' 10"  (1.778 m) 117.9 kg     Recent laboratory studies:  Recent Labs    07/10/18 0338  WBC 14.7*  HGB 12.7*  HCT 39.3  PLT 241     Discharge Medications:   Allergies as of 07/10/2018   No Known Allergies     Medication List    STOP taking these medications   diclofenac 50 MG tablet Commonly known as:  CATAFLAM     TAKE these medications   amLODipine 10 MG tablet Commonly known as:  NORVASC Take 10 mg by mouth daily.   aspirin EC 325 MG tablet Take 1 tablet (325 mg total) by mouth 2 (two) times daily after a meal. Take x 1 month post op to decrease risk of blood clots. What changed:    medication strength  how much to take  when to take this  additional instructions   docusate sodium 100 MG capsule Commonly known as:  COLACE Take 1 capsule (100 mg total) by mouth 2 (two) times daily.   hydrochlorothiazide 25 MG tablet Commonly known as:  HYDRODIURIL Take 25 mg by mouth daily.   lisinopril 40 MG tablet Commonly known as:  PRINIVIL,ZESTRIL Take 40 mg by mouth daily.   metFORMIN 1000 MG tablet Commonly known as:  GLUCOPHAGE Take 1,000 mg by mouth 2 (two) times daily with a meal.   multivitamin with minerals Tabs tablet  Take 1 tablet by mouth daily.   omeprazole 20 MG capsule Commonly known as:  PRILOSEC Take 20 mg by mouth daily.   oxyCODONE-acetaminophen 5-325 MG tablet Commonly known as:  PERCOCET/ROXICET Take 1-2 tablets by mouth every 6 (six) hours as needed for severe pain.   oxymetazoline 0.05 % nasal spray Commonly known as:  AFRIN Place 1 spray into both nostrils daily as needed for congestion.   pravastatin 20 MG tablet Commonly known as:  PRAVACHOL Take 20 mg by mouth every evening.   tiZANidine 2 MG tablet Commonly known as:  ZANAFLEX Take 1 tablet (2 mg total) by mouth every 8 (eight) hours as needed for muscle spasms.   vitamin B-12 1000 MCG tablet Commonly known as:  CYANOCOBALAMIN Take 1,000 mcg  by mouth daily.            Discharge Care Instructions  (From admission, onward)         Start     Ordered   07/10/18 0000  Weight bearing as tolerated    Question Answer Comment  Laterality left   Extremity Lower      07/10/18 0826          Diagnostic Studies: Dg Chest 2 View  Result Date: 06/27/2018 CLINICAL DATA:  Pre-admission exam prior to left total knee joint replacement. History of cardiac dysrhythmia, hypertension, never smoked. EXAM: CHEST - 2 VIEW COMPARISON:  Chest x-ray of May 13, 2009 FINDINGS: The lungs are adequately inflated. There is no focal infiltrate. There is no pleural effusion. The heart and pulmonary vascularity are normal. There is calcification in the wall of the aortic arch. The trachea is midline. The bony thorax exhibits no acute abnormality. IMPRESSION: There is no active cardiopulmonary disease. Thoracic aortic atherosclerosis. Electronically Signed   By: David  [place] M.D.   On: 06/27/2018 13:02    Disposition: Discharge disposition: 01-Home or Self Care       Discharge Instructions    CPM   Complete by:  As directed    Continuous passive motion machine (CPM):      Use the CPM from 0 to 80 for 8 hours per day.      You may increase by 5 per day.  You may break it up into 2 or 3 sessions per day.      Use CPM for 1-2 weeks or until you are told to stop.   Call MD / Call 911   Complete by:  As directed    If you experience chest pain or shortness of breath, CALL 911 and be transported to the hospital emergency room.  If you develope a fever above 101 F, pus (white drainage) or increased drainage or redness at the wound, or calf pain, call your surgeon's office.   Diet Carb Modified   Complete by:  As directed    Do not put a pillow under the knee. Place it under the heel.   Complete by:  As directed    Increase activity slowly as tolerated   Complete by:  As directed    Weight bearing as tolerated   Complete by:  As directed     Laterality:  left   Extremity:  Lower      Follow-up Information    Jodi Geralds, MD. Go on 07/23/2018.   Specialty:  Orthopedic Surgery Why:  Your appointment has been made for 10:00. Contact information: [address] [place] [address] [phone]        [facility] Go on 07/23/2018.   Why:  You will start outpatient therapy at 11:20 after your appointment with Dr Luiz BlareGraves. Please go over and do your paperwork after your office visit  Contact information: Physical Therapy [address] [place] [address] [phone]        [facility]   Specialty:  Home Health Services Why:  You will have 5 HHPT visits prior to your OPPT starting on 07/23/18  Contact information: [address] [place] [address] [phone]            Signed: Matthew FolksJames G Maxx Calaway 07/10/2018, 8:26 AM

## 2018-07-10 NOTE — Care Management (Signed)
Patient is Ortho Bundle, all services have been arranged by MD office Case Manager.  

## 2020-04-18 IMAGING — CR DG CHEST 2V
2 series · 2 of 2 positions shown · non-contrast
Comparison: Chest x-ray of May 13, 2009

CLINICAL DATA: Pre-admission exam prior to left total knee joint
replacement. History of cardiac dysrhythmia, hypertension, never
smoked.

EXAM:
CHEST - 2 VIEW

[w chest pa]
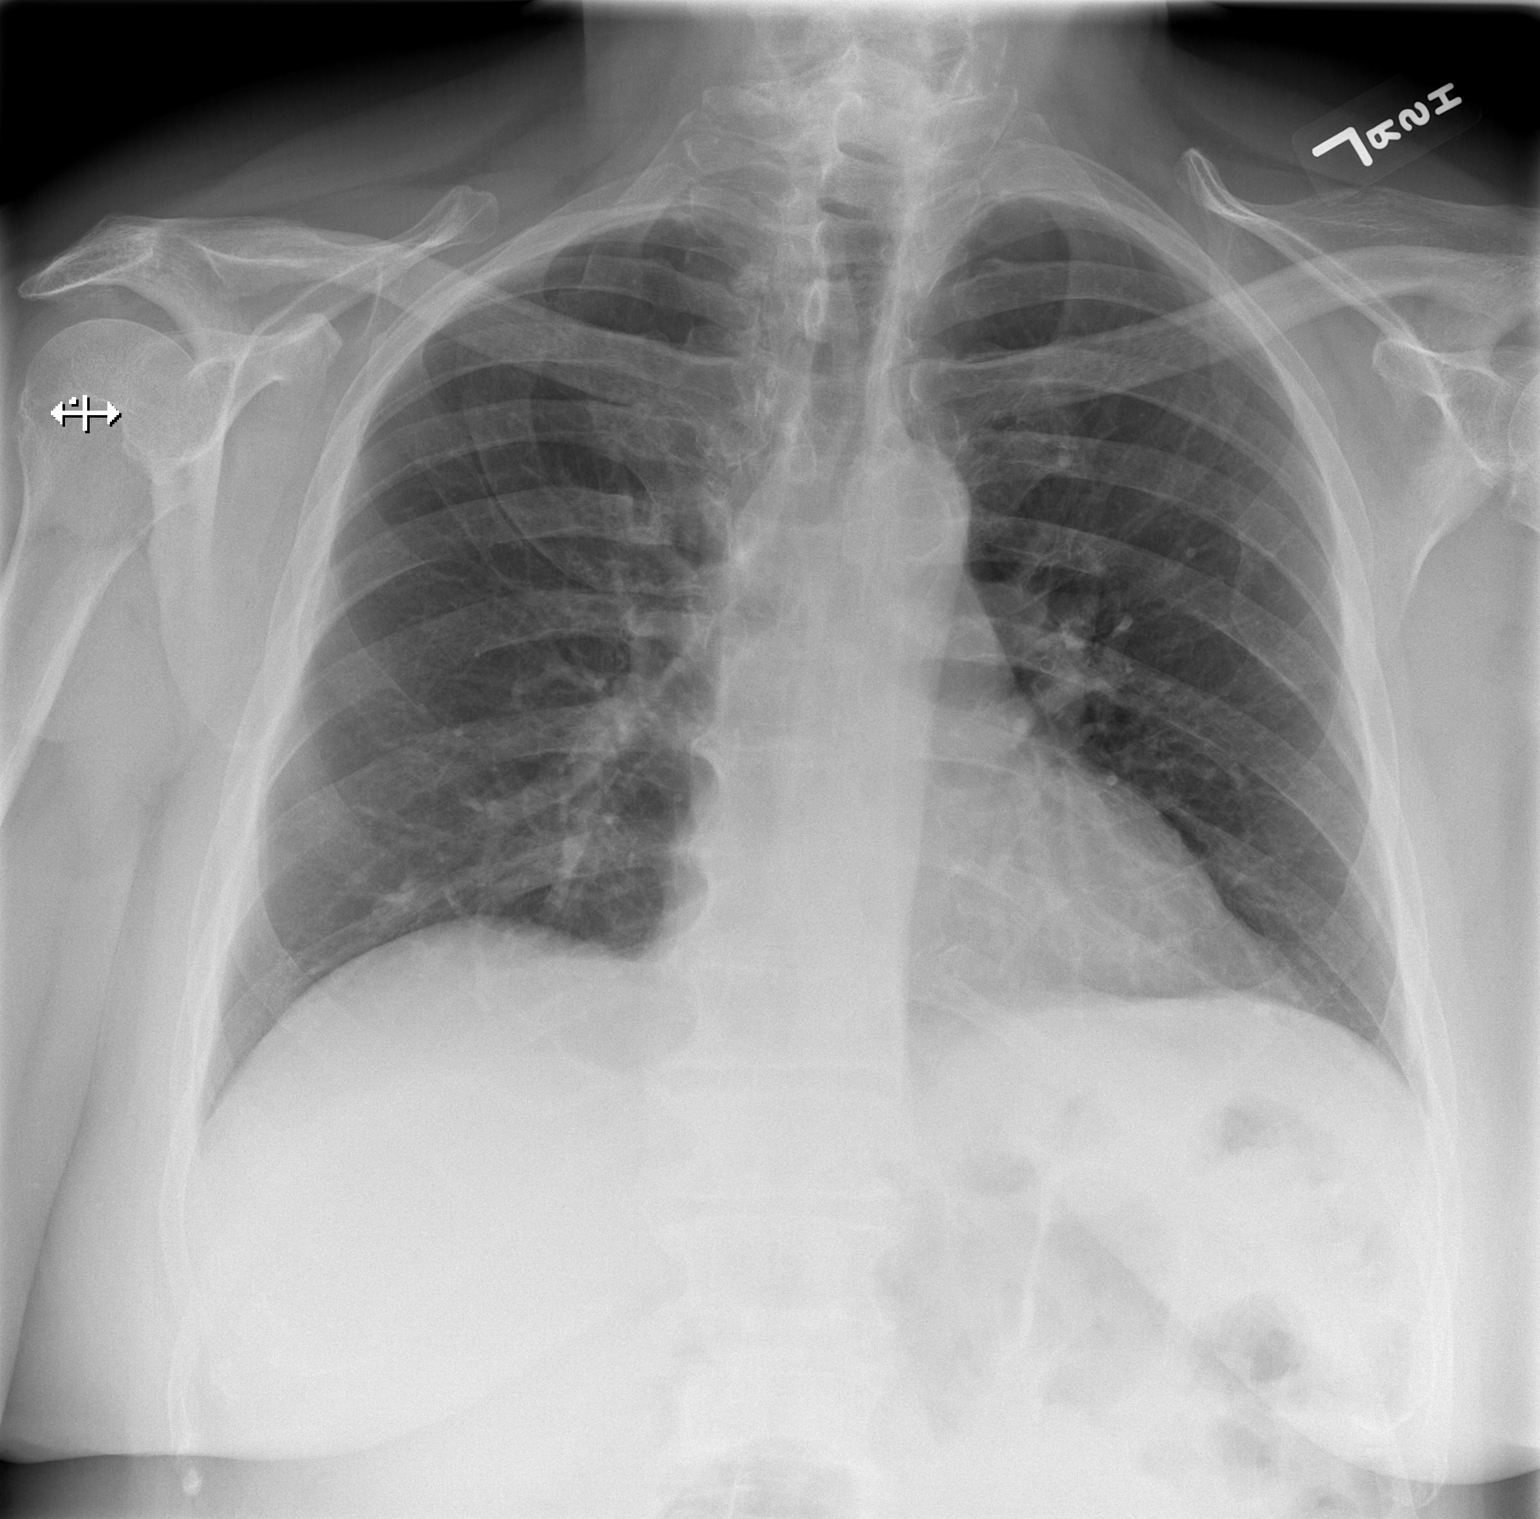

[w chest lat]
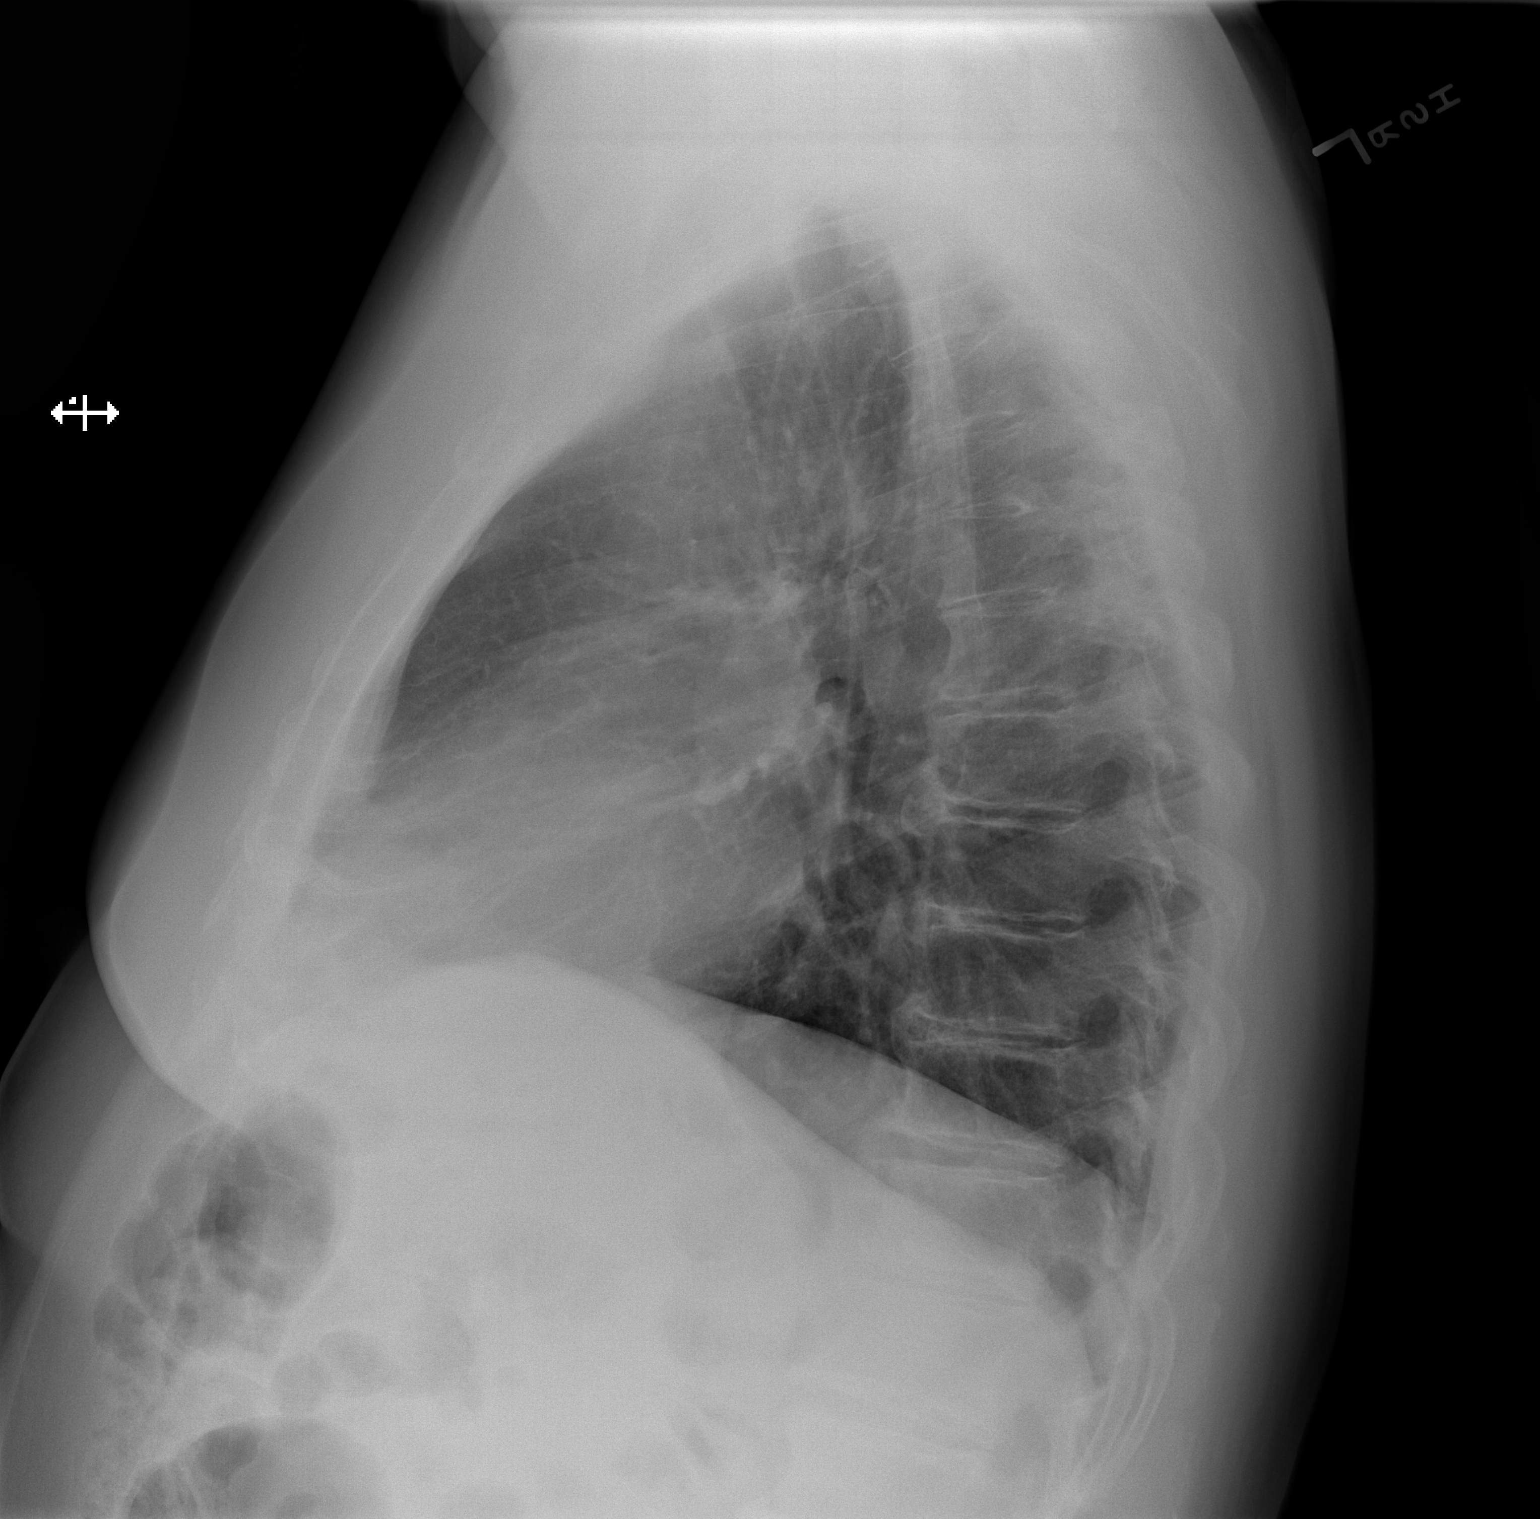

[2 of 2 positions shown; findings below may reference images not displayed]

FINDINGS: The lungs are adequately inflated. There is no focal infiltrate.
There is no pleural effusion. The heart and pulmonary vascularity
are normal. There is calcification in the wall of the aortic arch.
The trachea is midline. The bony thorax exhibits no acute
abnormality.
IMPRESSION: There is no active cardiopulmonary disease.

Thoracic aortic atherosclerosis.
